# Patient Record
Sex: Male | Born: 1960 | Race: White | Hispanic: No | Marital: Married | Smoking: Never smoker
Health system: Southern US, Community
[De-identification: ages and names within clinical notes are randomized; demographics above are authoritative.]

## PROBLEM LIST (undated history)

## (undated) DIAGNOSIS — F329 Major depressive disorder, single episode, unspecified: Secondary | ICD-10-CM

## (undated) DIAGNOSIS — Z9889 Other specified postprocedural states: Secondary | ICD-10-CM

## (undated) DIAGNOSIS — F32A Depression, unspecified: Secondary | ICD-10-CM

## (undated) DIAGNOSIS — R112 Nausea with vomiting, unspecified: Secondary | ICD-10-CM

## (undated) DIAGNOSIS — T8859XA Other complications of anesthesia, initial encounter: Secondary | ICD-10-CM

## (undated) DIAGNOSIS — E119 Type 2 diabetes mellitus without complications: Secondary | ICD-10-CM

## (undated) DIAGNOSIS — T4145XA Adverse effect of unspecified anesthetic, initial encounter: Secondary | ICD-10-CM

## (undated) DIAGNOSIS — I10 Essential (primary) hypertension: Secondary | ICD-10-CM

## (undated) DIAGNOSIS — R51 Headache: Secondary | ICD-10-CM

## (undated) DIAGNOSIS — M1990 Unspecified osteoarthritis, unspecified site: Secondary | ICD-10-CM

## (undated) DIAGNOSIS — F419 Anxiety disorder, unspecified: Secondary | ICD-10-CM

## (undated) DIAGNOSIS — J40 Bronchitis, not specified as acute or chronic: Secondary | ICD-10-CM

## (undated) HISTORY — PX: TONSILLECTOMY: SUR1361

## (undated) HISTORY — PX: KNEE SURGERY: SHX244

## (undated) HISTORY — PX: HERNIA REPAIR: SHX51

---

## 2012-05-30 ENCOUNTER — Other Ambulatory Visit: Payer: Self-pay | Admitting: Neurosurgery

## 2012-06-04 ENCOUNTER — Encounter (HOSPITAL_COMMUNITY): Payer: Self-pay | Admitting: Pharmacy Technician

## 2012-06-06 ENCOUNTER — Encounter (HOSPITAL_COMMUNITY)
Admission: RE | Admit: 2012-06-06 | Discharge: 2012-06-06 | Disposition: A | Discharge status: Home / Self Care | Payer: BC Managed Care – PPO | Source: Ambulatory Visit | Attending: Neurosurgery | Admitting: Neurosurgery

## 2012-06-06 ENCOUNTER — Encounter (HOSPITAL_COMMUNITY): Payer: Self-pay

## 2012-06-06 ENCOUNTER — Encounter (HOSPITAL_COMMUNITY)
Admission: RE | Admit: 2012-06-06 | Discharge: 2012-06-06 | Disposition: A | Discharge status: Home / Self Care | Payer: BC Managed Care – PPO | Source: Ambulatory Visit | Attending: Anesthesiology | Admitting: Anesthesiology

## 2012-06-06 HISTORY — DX: Other complications of anesthesia, initial encounter: T88.59XA

## 2012-06-06 HISTORY — DX: Anxiety disorder, unspecified: F41.9

## 2012-06-06 HISTORY — DX: Other specified postprocedural states: Z98.890

## 2012-06-06 HISTORY — DX: Major depressive disorder, single episode, unspecified: F32.9

## 2012-06-06 HISTORY — DX: Adverse effect of unspecified anesthetic, initial encounter: T41.45XA

## 2012-06-06 HISTORY — DX: Unspecified osteoarthritis, unspecified site: M19.90

## 2012-06-06 HISTORY — DX: Depression, unspecified: F32.A

## 2012-06-06 HISTORY — DX: Bronchitis, not specified as acute or chronic: J40

## 2012-06-06 HISTORY — DX: Essential (primary) hypertension: I10

## 2012-06-06 HISTORY — DX: Type 2 diabetes mellitus without complications: E11.9

## 2012-06-06 HISTORY — DX: Nausea with vomiting, unspecified: R11.2

## 2012-06-06 HISTORY — DX: Headache: R51

## 2012-06-06 LAB — SURGICAL PCR SCREEN: MRSA, PCR: NEGATIVE

## 2012-06-06 LAB — CBC
Hemoglobin: 13.1 g/dL (ref 13.0–17.0)
MCH: 29.6 pg (ref 26.0–34.0)
MCV: 88.5 fL (ref 78.0–100.0)
Platelets: 195 10*3/uL (ref 150–400)
RBC: 4.43 MIL/uL (ref 4.22–5.81)
WBC: 7.5 10*3/uL (ref 4.0–10.5)

## 2012-06-06 LAB — BASIC METABOLIC PANEL
CO2: 30 mEq/L (ref 19–32)
Calcium: 9.1 mg/dL (ref 8.4–10.5)
Chloride: 99 mEq/L (ref 96–112)
Glucose, Bld: 180 mg/dL — ABNORMAL HIGH (ref 70–99)
Sodium: 138 mEq/L (ref 135–145)

## 2012-06-06 MED ORDER — CEFAZOLIN SODIUM-DEXTROSE 2-3 GM-% IV SOLR
2.0000 g | INTRAVENOUS | Status: AC
  Administered 2012-06-07: 2 g via INTRAVENOUS
  Filled 2012-06-06: qty 50

## 2012-06-06 NOTE — Progress Notes (Signed)
06/06/12 0915  OBSTRUCTIVE SLEEP APNEA  Have you ever been diagnosed with sleep apnea through a sleep study? No  Do you snore loudly (loud enough to be heard through closed doors)?  1  Do you often feel tired, fatigued, or sleepy during the daytime? 0  Has anyone observed you stop breathing during your sleep? 0  Do you have, or are you being treated for high blood pressure? 1  BMI more than 35 kg/m2? 1  Age over 53 years old? 1  Neck circumference greater than 40 cm/18 inches? 1 (18 1/2 inches)  Gender: 1  Obstructive Sleep Apnea Score 6   Score 4 or greater  Results sent to PCP

## 2012-06-06 NOTE — H&P (Signed)
NEUROSURGICAL CONSULTATION  Trapper A. 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  He complains of back and left leg pain.  He complains of left leg pain for the last three months, right leg only recently.  He says his low back pain has been worse than his leg now since he has been started on Gabapentin but prior to Gabapentin his leg pain was much worse than his back pain.  He notes numbness into both feet, weakness in his left leg and says this has been going on for the last three months.  He has been taking over-the-counter medications for years for his low back but says that the left leg pain is quite severe.  He went to physical therapy for chiropractic treatment.  He said traction helped some but he has not had a great deal of relief.  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  He has had previous knee arthroscopic surgery in 11/2001.  He says it is very hard for him to stand.  He has to lean forward in order to try to do so.    REVIEW OF SYSTEMS:   A detailed Review of Systems sheet was reviewed with the patient.  Pertinent positives include:  Eyes-glasses; Cardiovascular-high blood pressure, leg pain while walking; Musculoskeletal-back pain, leg pain; Psychiatric-anxiety; Endocrine-diabetes.  All other systems are negative; this includes Constitutional symptoms, Ears, nose, mouth, throat, Respiratory, Gastrointestinal, Genitourinary, Integumentary & Breast, Neurologic, Hematologic/Lymphatic, Allergic/Immunologic.    PAST MEDICAL HISTORY:      Current Medical Conditions:    As previously described.      Prior Operations and Hospitalizations:   As previously described.    Medications and Allergies:   He is currently  taking Gabapentin 300 mg. b.i.d., Hydrocodone q.i.d., but nothing lasts or helps for greater than an hour.  Mobic 15 mg. q.a.m., Carvedilol 25 mg., Alprazolam 1 mg., Januvia 100 mg., Sertraline 100 mg., Lantus 40 units, Humalog sliding scale.   No known drug allergies.      Height and Weight:     5'11" tall, 250 pounds.       FAMILY HISTORY:    Mother 81 in good health with diabetes.  Father is 61 in poor health with Shy-Drager syndrome.   SOCIAL HISTORY:    He is a nonsmoker and nondrinker with no history of substance abuse.      DIAGNOSTIC STUDIES:   Mr. Sia had an MRI of his lumbar spine which shows that at L4-5 there is left sided annular protrusion and compression of the left L5 nerve root with soft tissue in the epidural space further compressing the left L5 nerve root and extending into the neural foramen which appears to be consistent with herniated disc material.      PHYSICAL EXAMINATION:      General Appearance:   On examination today, Cory Kidd is a pleasant and cooperative man in no acute distress.        Blood Pressure, Pulse:     152/100, heart rate 74 and regular, respirations 16.      HEENT - normocephalic, atraumatic.  The pupils are equal, round and reactive to light.  The extraocular muscles are intact.  Sclerae - white.  Conjunctiva - pink.  Oropharynx benign.  Uvula midline.     Neck - there are no masses, meningismus, deformities, tracheal deviation, jugular vein distention or carotid bruits.  There is normal cervical range of motion.  Spurlings' test is negative without reproducible radicular pain turning the patient's head to either side.  Lhermitte's sign is not present with axial compression.      Respiratory - there is normal respiratory effort with good intercostal function.  Lungs are clear to auscultation.  There are no rales, rhonchi or wheezes.      Cardiovascular - the heart has regular rate and rhythm to auscultation.  No murmurs are appreciated.   There is no extremity edema, cyanosis or clubbing.  There are palpable pedal pulses.      Abdomen - soft, nontender, no hepatosplenomegaly appreciated or masses.  There are active bowel sounds.  No guarding or rebound.      Musculoskeletal Examination - he has midline low back pain to palpation.  He has left sciatic notch discomfort.  He is able to bend to the level of his knees with his upper extremities outstretched.  He is able to stand on his heels and toes and walks with an antalgic gait favoring his left lower extremity.  He has a decreased ability to swat on the left leg.  He has positive straight leg raise on the left at 20 degrees and on the right at 50 degrees.    NEUROLOGICAL EXAMINATION: The patient is oriented to time, person and place and has good recall of both recent and remote memory with normal attention span and concentration.  The patient speaks with clear and fluent speech and exhibits normal language function and appropriate fund of knowledge.        Cranial Nerve Examination - pupils are equal, round and reactive to light.  Extraocular movements are full.  Visual fields are full to confrontational testing.  Facial sensation and facial movement are symmetric and intact.  Hearing is intact to finger rub.  Palate is upgoing.  Shoulder shrug is symmetric.  Tongue protrudes in the midline.      Motor Examination - motor strength is 5/5 in the bilateral deltoids, biceps, triceps, handgrips, wrist extensors, interosseous.  In the lower extremities motor strength is 5/5 in hip flexion, extension, quadriceps, hamstrings, plantar flexion, dorsiflexion and extensor hallucis longus.  He has the decreased ability to squat on the left leg.      Sensory Examination - he notes decreased pin sensation in the left L4 distribution.      Deep Tendon Reflexes - 2 in the biceps, triceps, and brachioradialis, 2 in the knees, 2 in the ankles.  The great toes are downgoing to plantar stimulation.  No  pathologic reflexes.       Cerebellar Examination - normal coordination in upper and lower extremities and normal rapid alternating movements.  Romberg test is negative.    IMPRESSION AND RECOMMENDATIONS: Ayman Brull is a 51 year old man from [place] with low back and left leg pain with a large disc herniation at L4-5 on the left which appears to be causing compression of both the L5 and L4 nerve root.    I have recommended that he undergo surgery for this given the severity of his pain and weakness and physical findings with the large free fragment of herniated disc material.  He wishes to proceed and we will set this up on 06/07/2012.  Risks and benefits are discussed.  We went over the surgical models.  I reviewed the studies with the patient and went over his physical examination.  I reviewed surgical models and discussed the typical hospital course and operative and postoperative course and the potential risks and benefits of surgery.  The risks of surgery were discussed in detail and include, but are not limited to, the risks of anesthesia, blood loss and the possibility of hemorrhage, infection, damage to nerves, damage to blood vessels, injury to the lumbar nerve root causing either temporary or permanent leg pain, numbness, weakness.  There is potential for spinal fluid leak from dural tear.  There is the potential for post-laminectomy spondylolisthesis, recurrent disc ruptured quoted at approximately 10%, failure to relieve pain, worsening of pain, need for further surgery.      [facility]    Danae Orleans. Venetia Maxon, M.D.  JDS:gde

## 2012-06-06 NOTE — Pre-Procedure Instructions (Signed)
20 Cory Kidd  06/06/2012   Your procedure is scheduled on:  Friday June 07, 2012  Report to [facility] at 6:00 AM.  Call this number if you have problems the morning of surgery: [phone]   Remember:   Do not eat food or drink :After Midnight.    Take these medicines the morning of surgery with A SIP OF WATER: xanax, carvedilol, gabapentin, percocet, zoloft    Do not wear jewelry, make-up or nail polish.  Do not wear lotions, powders, or perfumes.  Do not shave 48 hours prior to surgery.  Do not bring valuables to the hospital.  Contacts, dentures or bridgework may not be worn into surgery.  Leave suitcase in the car. After surgery it may be brought to your room.  For patients admitted to the hospital, checkout time is 11:00 AM the day of discharge.   Patients discharged the day of surgery will not be allowed to drive home.  Name and phone number of your driver: family / friend  Special Instructions: Shower using CHG 2 nights before surgery and the night before surgery.  If you shower the day of surgery use CHG.  Use special wash - you have one bottle of CHG for all showers.  You should use approximately 1/3 of the bottle for each shower.   Please read over the following fact sheets that you were given: Pain Booklet, Coughing and Deep Breathing, MRSA Information and Surgical Site Infection Prevention

## 2012-06-06 NOTE — Progress Notes (Signed)
06/06/12 1012  OBSTRUCTIVE SLEEP APNEA  Have you ever been diagnosed with sleep apnea through a sleep study? No  Do you snore loudly (loud enough to be heard through closed doors)?  1  Do you often feel tired, fatigued, or sleepy during the daytime? 0  Has anyone observed you stop breathing during your sleep? 0  Do you have, or are you being treated for high blood pressure? 1  BMI more than 35 kg/m2? 1  Age over 53 years old? 1  Neck circumference greater than 40 cm/18 inches? 1  Gender: 1  Obstructive Sleep Apnea Score 6   Score 4 or greater  Results sent to PCP

## 2012-06-07 ENCOUNTER — Encounter (HOSPITAL_COMMUNITY): Payer: Self-pay | Admitting: Surgery

## 2012-06-07 ENCOUNTER — Encounter (HOSPITAL_COMMUNITY): Payer: Self-pay | Admitting: Anesthesiology

## 2012-06-07 ENCOUNTER — Ambulatory Visit (HOSPITAL_COMMUNITY): Payer: BC Managed Care – PPO | Admitting: Anesthesiology

## 2012-06-07 ENCOUNTER — Ambulatory Visit (HOSPITAL_COMMUNITY): Payer: BC Managed Care – PPO

## 2012-06-07 ENCOUNTER — Ambulatory Visit (HOSPITAL_COMMUNITY)
Admission: RE | Admit: 2012-06-07 | Discharge: 2012-06-07 | Disposition: A | Discharge status: Home / Self Care | Payer: BC Managed Care – PPO | Source: Ambulatory Visit | Attending: Neurosurgery | Admitting: Neurosurgery

## 2012-06-07 ENCOUNTER — Encounter (HOSPITAL_COMMUNITY)
Admission: RE | Disposition: A | Discharge status: Home / Self Care | Payer: Self-pay | Source: Ambulatory Visit | Attending: Neurosurgery

## 2012-06-07 DIAGNOSIS — I10 Essential (primary) hypertension: Secondary | ICD-10-CM | POA: Insufficient documentation

## 2012-06-07 DIAGNOSIS — M5137 Other intervertebral disc degeneration, lumbosacral region: Secondary | ICD-10-CM | POA: Insufficient documentation

## 2012-06-07 DIAGNOSIS — Z01810 Encounter for preprocedural cardiovascular examination: Secondary | ICD-10-CM | POA: Insufficient documentation

## 2012-06-07 DIAGNOSIS — Z01818 Encounter for other preprocedural examination: Secondary | ICD-10-CM | POA: Insufficient documentation

## 2012-06-07 DIAGNOSIS — M47817 Spondylosis without myelopathy or radiculopathy, lumbosacral region: Secondary | ICD-10-CM | POA: Insufficient documentation

## 2012-06-07 DIAGNOSIS — E119 Type 2 diabetes mellitus without complications: Secondary | ICD-10-CM | POA: Insufficient documentation

## 2012-06-07 DIAGNOSIS — Z794 Long term (current) use of insulin: Secondary | ICD-10-CM | POA: Insufficient documentation

## 2012-06-07 DIAGNOSIS — M5126 Other intervertebral disc displacement, lumbar region: Secondary | ICD-10-CM | POA: Insufficient documentation

## 2012-06-07 DIAGNOSIS — Z01812 Encounter for preprocedural laboratory examination: Secondary | ICD-10-CM | POA: Insufficient documentation

## 2012-06-07 DIAGNOSIS — M51379 Other intervertebral disc degeneration, lumbosacral region without mention of lumbar back pain or lower extremity pain: Secondary | ICD-10-CM | POA: Insufficient documentation

## 2012-06-07 HISTORY — PX: LUMBAR LAMINECTOMY/DECOMPRESSION MICRODISCECTOMY: SHX5026

## 2012-06-07 LAB — GLUCOSE, CAPILLARY
Glucose-Capillary: 177 mg/dL — ABNORMAL HIGH (ref 70–99)
Glucose-Capillary: 141 mg/dL — ABNORMAL HIGH (ref 70–99)
Glucose-Capillary: 160 mg/dL — ABNORMAL HIGH (ref 70–99)

## 2012-06-07 SURGERY — LUMBAR LAMINECTOMY/DECOMPRESSION MICRODISCECTOMY 1 LEVEL
Anesthesia: General | Site: Back | Laterality: Left | Wound class: Clean

## 2012-06-07 MED ORDER — ALPRAZOLAM 0.5 MG PO TABS: 1.0000 mg | ORAL_TABLET | Freq: Two times a day (BID) | ORAL | Status: DC | PRN

## 2012-06-07 MED ORDER — MELOXICAM 15 MG PO TABS: 15.0000 mg | ORAL_TABLET | Freq: Every day | ORAL | Status: DC

## 2012-06-07 MED ORDER — FLEET ENEMA 7-19 GM/118ML RE ENEM
1.0000 | ENEMA | Freq: Once | RECTAL | Status: DC | PRN
  Filled 2012-06-07: qty 1

## 2012-06-07 MED ORDER — PHENYLEPHRINE HCL 10 MG/ML IJ SOLN
INTRAMUSCULAR | Status: DC | PRN
  Administered 2012-06-07 (×2): 40 ug via INTRAVENOUS
  Administered 2012-06-07: 80 ug via INTRAVENOUS
  Administered 2012-06-07 (×2): 40 ug via INTRAVENOUS
  Administered 2012-06-07: 80 ug via INTRAVENOUS
  Administered 2012-06-07 (×2): 40 ug via INTRAVENOUS

## 2012-06-07 MED ORDER — INFLUENZA VIRUS VACC SPLIT PF IM SUSP
0.5000 mL | Freq: Once | INTRAMUSCULAR | Status: AC
  Administered 2012-06-07: 0.5 mL via INTRAMUSCULAR
  Filled 2012-06-07: qty 0.5

## 2012-06-07 MED ORDER — MUPIROCIN 2 % EX OINT: 1.0000 "application " | TOPICAL_OINTMENT | Freq: Two times a day (BID) | CUTANEOUS | Status: DC

## 2012-06-07 MED ORDER — DIAZEPAM 5 MG PO TABS: 5.0000 mg | ORAL_TABLET | Freq: Four times a day (QID) | ORAL | Status: DC | PRN

## 2012-06-07 MED ORDER — ALUM & MAG HYDROXIDE-SIMETH 200-200-20 MG/5ML PO SUSP: 30.0000 mL | Freq: Four times a day (QID) | ORAL | Status: DC | PRN

## 2012-06-07 MED ORDER — BISACODYL 10 MG RE SUPP: 10.0000 mg | Freq: Every day | RECTAL | Status: DC | PRN

## 2012-06-07 MED ORDER — SERTRALINE HCL 100 MG PO TABS
100.0000 mg | ORAL_TABLET | Freq: Every day | ORAL | Status: DC
  Filled 2012-06-07: qty 1

## 2012-06-07 MED ORDER — HYDROMORPHONE HCL PF 1 MG/ML IJ SOLN
INTRAMUSCULAR | Status: AC
  Filled 2012-06-07: qty 2

## 2012-06-07 MED ORDER — BACITRACIN-NEOMYCIN-POLYMYXIN 400-5-5000 EX OINT: 1.0000 "application " | TOPICAL_OINTMENT | CUTANEOUS | Status: DC | PRN

## 2012-06-07 MED ORDER — MORPHINE SULFATE 2 MG/ML IJ SOLN: 1.0000 mg | INTRAMUSCULAR | Status: DC | PRN

## 2012-06-07 MED ORDER — PANTOPRAZOLE SODIUM 40 MG IV SOLR
40.0000 mg | Freq: Every day | INTRAVENOUS | Status: DC
  Filled 2012-06-07: qty 40

## 2012-06-07 MED ORDER — INSULIN GLARGINE 100 UNIT/ML ~~LOC~~ SOLN: 20.0000 [IU] | Freq: Two times a day (BID) | SUBCUTANEOUS | Status: DC

## 2012-06-07 MED ORDER — CEFAZOLIN SODIUM 1-5 GM-% IV SOLN
1.0000 g | Freq: Three times a day (TID) | INTRAVENOUS | Status: DC
  Filled 2012-06-07 (×2): qty 50

## 2012-06-07 MED ORDER — SODIUM CHLORIDE 0.9 % IJ SOLN: 3.0000 mL | INTRAMUSCULAR | Status: DC | PRN

## 2012-06-07 MED ORDER — INSULIN ASPART 100 UNIT/ML ~~LOC~~ SOLN: 0.0000 [IU] | Freq: Three times a day (TID) | SUBCUTANEOUS | Status: DC

## 2012-06-07 MED ORDER — DOCUSATE SODIUM 100 MG PO CAPS: 100.0000 mg | ORAL_CAPSULE | Freq: Two times a day (BID) | ORAL | Status: DC

## 2012-06-07 MED ORDER — PROPOFOL 10 MG/ML IV BOLUS
INTRAVENOUS | Status: DC | PRN
  Administered 2012-06-07: 180 mg via INTRAVENOUS

## 2012-06-07 MED ORDER — ONDANSETRON HCL 4 MG/2ML IJ SOLN: 4.0000 mg | Freq: Once | INTRAMUSCULAR | Status: DC | PRN

## 2012-06-07 MED ORDER — SCOPOLAMINE 1 MG/3DAYS TD PT72
MEDICATED_PATCH | TRANSDERMAL | Status: AC
  Administered 2012-06-07: 1.5 mg via TRANSDERMAL
  Filled 2012-06-07: qty 1

## 2012-06-07 MED ORDER — LIDOCAINE HCL (CARDIAC) 20 MG/ML IV SOLN
INTRAVENOUS | Status: DC | PRN
  Administered 2012-06-07: 30 mg via INTRAVENOUS

## 2012-06-07 MED ORDER — ZOLPIDEM TARTRATE 5 MG PO TABS: 5.0000 mg | ORAL_TABLET | Freq: Every evening | ORAL | Status: DC | PRN

## 2012-06-07 MED ORDER — OXYCODONE-ACETAMINOPHEN 10-325 MG PO TABS
1.0000 | ORAL_TABLET | Freq: Four times a day (QID) | ORAL | Status: DC | PRN
  Filled 2012-06-07: qty 1

## 2012-06-07 MED ORDER — FENTANYL CITRATE 0.05 MG/ML IJ SOLN
INTRAMUSCULAR | Status: DC | PRN
  Administered 2012-06-07: 100 ug via INTRAVENOUS

## 2012-06-07 MED ORDER — PHENOL 1.4 % MT LIQD: 1.0000 | OROMUCOSAL | Status: DC | PRN

## 2012-06-07 MED ORDER — BACITRACIN 50000 UNITS IM SOLR
INTRAMUSCULAR | Status: AC
  Filled 2012-06-07: qty 1

## 2012-06-07 MED ORDER — THROMBIN 5000 UNITS EX SOLR
CUTANEOUS | Status: DC | PRN
  Administered 2012-06-07 (×2): 5000 [IU] via TOPICAL

## 2012-06-07 MED ORDER — MUPIROCIN 2 % EX OINT
TOPICAL_OINTMENT | Freq: Two times a day (BID) | CUTANEOUS | Status: DC
  Administered 2012-06-07: 1 via NASAL

## 2012-06-07 MED ORDER — VECURONIUM BROMIDE 10 MG IV SOLR
INTRAVENOUS | Status: DC | PRN
  Administered 2012-06-07 (×2): 1 mg via INTRAVENOUS

## 2012-06-07 MED ORDER — ONDANSETRON HCL 4 MG/2ML IJ SOLN
INTRAMUSCULAR | Status: DC | PRN
  Administered 2012-06-07 (×2): 4 mg via INTRAVENOUS

## 2012-06-07 MED ORDER — HEMOSTATIC AGENTS (NO CHARGE) OPTIME
TOPICAL | Status: DC | PRN
  Administered 2012-06-07: 1 via TOPICAL

## 2012-06-07 MED ORDER — ACETAMINOPHEN 325 MG PO TABS: 650.0000 mg | ORAL_TABLET | ORAL | Status: DC | PRN

## 2012-06-07 MED ORDER — CARVEDILOL 25 MG PO TABS
25.0000 mg | ORAL_TABLET | Freq: Two times a day (BID) | ORAL | Status: DC
  Filled 2012-06-07 (×2): qty 1

## 2012-06-07 MED ORDER — GABAPENTIN 300 MG PO CAPS
300.0000 mg | ORAL_CAPSULE | Freq: Two times a day (BID) | ORAL | Status: DC
  Filled 2012-06-07 (×2): qty 1

## 2012-06-07 MED ORDER — MUPIROCIN 2 % EX OINT
TOPICAL_OINTMENT | CUTANEOUS | Status: AC
  Administered 2012-06-07: 1 via NASAL
  Filled 2012-06-07: qty 22

## 2012-06-07 MED ORDER — ACETAMINOPHEN 10 MG/ML IV SOLN: 1000.0000 mg | Freq: Once | INTRAVENOUS | Status: DC | PRN

## 2012-06-07 MED ORDER — LACTATED RINGERS IV SOLN
INTRAVENOUS | Status: DC | PRN
  Administered 2012-06-07 (×2): via INTRAVENOUS

## 2012-06-07 MED ORDER — EPHEDRINE SULFATE 50 MG/ML IJ SOLN
INTRAMUSCULAR | Status: DC | PRN
  Administered 2012-06-07: 10 mg via INTRAVENOUS

## 2012-06-07 MED ORDER — ACETAMINOPHEN 650 MG RE SUPP: 650.0000 mg | RECTAL | Status: DC | PRN

## 2012-06-07 MED ORDER — INSULIN ASPART 100 UNIT/ML ~~LOC~~ SOLN: 0.0000 [IU] | Freq: Every day | SUBCUTANEOUS | Status: DC

## 2012-06-07 MED ORDER — DEXTROSE 5 % IV SOLN
10.0000 mg | INTRAVENOUS | Status: DC | PRN
  Administered 2012-06-07: 20 ug/min via INTRAVENOUS

## 2012-06-07 MED ORDER — HYDROCODONE-ACETAMINOPHEN 5-325 MG PO TABS: 1.0000 | ORAL_TABLET | ORAL | Status: DC | PRN

## 2012-06-07 MED ORDER — MENTHOL 3 MG MT LOZG: 1.0000 | LOZENGE | OROMUCOSAL | Status: DC | PRN

## 2012-06-07 MED ORDER — POLYETHYLENE GLYCOL 3350 17 G PO PACK
17.0000 g | PACK | Freq: Every day | ORAL | Status: DC | PRN
  Filled 2012-06-07: qty 1

## 2012-06-07 MED ORDER — LINAGLIPTIN 5 MG PO TABS
5.0000 mg | ORAL_TABLET | Freq: Every day | ORAL | Status: DC
  Filled 2012-06-07: qty 1

## 2012-06-07 MED ORDER — SODIUM CHLORIDE 0.9 % IV SOLN: 250.0000 mL | INTRAVENOUS | Status: DC

## 2012-06-07 MED ORDER — ONDANSETRON HCL 4 MG/2ML IJ SOLN: 4.0000 mg | INTRAMUSCULAR | Status: DC | PRN

## 2012-06-07 MED ORDER — SODIUM CHLORIDE 0.9 % IJ SOLN: 3.0000 mL | Freq: Two times a day (BID) | INTRAMUSCULAR | Status: DC

## 2012-06-07 MED ORDER — HYDROMORPHONE HCL PF 1 MG/ML IJ SOLN
0.2500 mg | INTRAMUSCULAR | Status: DC | PRN
  Administered 2012-06-07 (×3): 0.5 mg via INTRAVENOUS

## 2012-06-07 MED ORDER — FENTANYL CITRATE 0.05 MG/ML IJ SOLN
INTRAMUSCULAR | Status: DC | PRN
  Administered 2012-06-07: 150 ug via INTRAVENOUS

## 2012-06-07 MED ORDER — ADULT MULTIVITAMIN W/MINERALS CH
1.0000 | ORAL_TABLET | Freq: Every day | ORAL | Status: DC
  Filled 2012-06-07: qty 1

## 2012-06-07 MED ORDER — NEOSTIGMINE METHYLSULFATE 1 MG/ML IJ SOLN
INTRAMUSCULAR | Status: DC | PRN
  Administered 2012-06-07: 5 mg via INTRAVENOUS

## 2012-06-07 MED ORDER — INSULIN ASPART 100 UNIT/ML ~~LOC~~ SOLN: 4.0000 [IU] | Freq: Three times a day (TID) | SUBCUTANEOUS | Status: DC

## 2012-06-07 MED ORDER — SODIUM CHLORIDE 0.9 % IV SOLN
INTRAVENOUS | Status: AC
  Filled 2012-06-07: qty 500

## 2012-06-07 MED ORDER — LIDOCAINE-EPINEPHRINE 1 %-1:100000 IJ SOLN
INTRAMUSCULAR | Status: DC | PRN
  Administered 2012-06-07: 10 mL

## 2012-06-07 MED ORDER — GLYCOPYRROLATE 0.2 MG/ML IJ SOLN
INTRAMUSCULAR | Status: DC | PRN
  Administered 2012-06-07: .8 mg via INTRAVENOUS

## 2012-06-07 MED ORDER — ACETAMINOPHEN 10 MG/ML IV SOLN
INTRAVENOUS | Status: AC
  Administered 2012-06-07: 1000 mg via INTRAVENOUS
  Filled 2012-06-07: qty 100

## 2012-06-07 MED ORDER — MIDAZOLAM HCL 5 MG/5ML IJ SOLN
INTRAMUSCULAR | Status: DC | PRN
  Administered 2012-06-07: 2 mg via INTRAVENOUS

## 2012-06-07 MED ORDER — SODIUM CHLORIDE 0.9 % IR SOLN
Status: DC | PRN
  Administered 2012-06-07: 10:00:00

## 2012-06-07 MED ORDER — FENTANYL CITRATE 0.05 MG/ML IJ SOLN
INTRAMUSCULAR | Status: AC
  Filled 2012-06-07: qty 2

## 2012-06-07 MED ORDER — KCL IN DEXTROSE-NACL 20-5-0.45 MEQ/L-%-% IV SOLN
INTRAVENOUS | Status: DC
  Filled 2012-06-07 (×2): qty 1000

## 2012-06-07 MED ORDER — METHYLPREDNISOLONE ACETATE 80 MG/ML IJ SUSP
INTRAMUSCULAR | Status: DC | PRN
  Administered 2012-06-07: 80 mg via INTRA_ARTICULAR

## 2012-06-07 MED ORDER — BUPIVACAINE HCL (PF) 0.5 % IJ SOLN
INTRAMUSCULAR | Status: DC | PRN
  Administered 2012-06-07: 10 mL

## 2012-06-07 MED ORDER — SENNA 8.6 MG PO TABS: 1.0000 | ORAL_TABLET | Freq: Two times a day (BID) | ORAL | Status: DC

## 2012-06-07 MED ORDER — OXYCODONE-ACETAMINOPHEN 5-325 MG PO TABS
1.0000 | ORAL_TABLET | ORAL | Status: DC | PRN
  Administered 2012-06-07: 2 via ORAL
  Filled 2012-06-07: qty 2

## 2012-06-07 MED ORDER — ROCURONIUM BROMIDE 100 MG/10ML IV SOLN
INTRAVENOUS | Status: DC | PRN
Start: 2012-06-07 — End: 2012-06-07
  Administered 2012-06-07: 50 mg via INTRAVENOUS

## 2012-06-07 SURGICAL SUPPLY — 60 items
BAG DECANTER FOR FLEXI CONT (MISCELLANEOUS) ×2 IMPLANT
BENZOIN TINCTURE PRP APPL 2/3 (GAUZE/BANDAGES/DRESSINGS) ×2 IMPLANT
BIT DRILL NEURO 2X3.1 SFT TUCH (MISCELLANEOUS) ×1 IMPLANT
BLADE SURG ROTATE 9660 (MISCELLANEOUS) ×2 IMPLANT
BUR ROUND FLUTED 5 RND (BURR) ×2 IMPLANT
CANISTER SUCTION 2500CC (MISCELLANEOUS) ×2 IMPLANT
CLOTH BEACON ORANGE TIMEOUT ST (SAFETY) ×2 IMPLANT
CONT SPEC 4OZ CLIKSEAL STRL BL (MISCELLANEOUS) ×2 IMPLANT
DERMABOND ADVANCED (GAUZE/BANDAGES/DRESSINGS) ×1
DERMABOND ADVANCED .7 DNX12 (GAUZE/BANDAGES/DRESSINGS) ×1 IMPLANT
DRAPE LAPAROTOMY 100X72X124 (DRAPES) ×2 IMPLANT
DRAPE MICROSCOPE LEICA (MISCELLANEOUS) IMPLANT
DRAPE MICROSCOPE ZEISS OPMI (DRAPES) ×2 IMPLANT
DRAPE POUCH INSTRU U-SHP 10X18 (DRAPES) ×2 IMPLANT
DRAPE SURG 17X23 STRL (DRAPES) ×2 IMPLANT
DRESSING TELFA 8X3 (GAUZE/BANDAGES/DRESSINGS) IMPLANT
DRILL NEURO 2X3.1 SOFT TOUCH (MISCELLANEOUS) ×2
DURAPREP 26ML APPLICATOR (WOUND CARE) ×2 IMPLANT
ELECT BLADE 4.0 EZ CLEAN MEGAD (MISCELLANEOUS) ×2
ELECT REM PT RETURN 9FT ADLT (ELECTROSURGICAL) ×2
ELECTRODE BLDE 4.0 EZ CLN MEGD (MISCELLANEOUS) ×1 IMPLANT
ELECTRODE REM PT RTRN 9FT ADLT (ELECTROSURGICAL) ×1 IMPLANT
GAUZE SPONGE 4X4 16PLY XRAY LF (GAUZE/BANDAGES/DRESSINGS) IMPLANT
GLOVE BIO SURGEON STRL SZ8 (GLOVE) ×2 IMPLANT
GLOVE BIOGEL PI IND STRL 8 (GLOVE) ×2 IMPLANT
GLOVE BIOGEL PI IND STRL 8.5 (GLOVE) ×1 IMPLANT
GLOVE BIOGEL PI INDICATOR 8 (GLOVE) ×2
GLOVE BIOGEL PI INDICATOR 8.5 (GLOVE) ×1
GLOVE ECLIPSE 7.5 STRL STRAW (GLOVE) ×4 IMPLANT
GLOVE ECLIPSE 8.0 STRL XLNG CF (GLOVE) ×2 IMPLANT
GLOVE EXAM NITRILE LRG STRL (GLOVE) IMPLANT
GLOVE EXAM NITRILE MD LF STRL (GLOVE) ×2 IMPLANT
GLOVE EXAM NITRILE XL STR (GLOVE) IMPLANT
GLOVE EXAM NITRILE XS STR PU (GLOVE) IMPLANT
GLOVE INDICATOR 7.0 STRL GRN (GLOVE) ×2 IMPLANT
GLOVE SURG SS PI 7.0 STRL IVOR (GLOVE) ×4 IMPLANT
GOWN BRE IMP SLV AUR LG STRL (GOWN DISPOSABLE) IMPLANT
GOWN BRE IMP SLV AUR XL STRL (GOWN DISPOSABLE) ×2 IMPLANT
GOWN STRL REIN 2XL LVL4 (GOWN DISPOSABLE) ×6 IMPLANT
KIT BASIN OR (CUSTOM PROCEDURE TRAY) ×2 IMPLANT
KIT ROOM TURNOVER OR (KITS) ×2 IMPLANT
NEEDLE HYPO 18GX1.5 BLUNT FILL (NEEDLE) ×2 IMPLANT
NEEDLE HYPO 25X1 1.5 SAFETY (NEEDLE) ×2 IMPLANT
NS IRRIG 1000ML POUR BTL (IV SOLUTION) ×2 IMPLANT
PACK LAMINECTOMY NEURO (CUSTOM PROCEDURE TRAY) ×2 IMPLANT
PAD ARMBOARD 7.5X6 YLW CONV (MISCELLANEOUS) ×6 IMPLANT
RUBBERBAND STERILE (MISCELLANEOUS) ×4 IMPLANT
SPONGE GAUZE 4X4 12PLY (GAUZE/BANDAGES/DRESSINGS) ×2 IMPLANT
SPONGE SURGIFOAM ABS GEL SZ50 (HEMOSTASIS) ×2 IMPLANT
STRIP CLOSURE SKIN 1/2X4 (GAUZE/BANDAGES/DRESSINGS) ×2 IMPLANT
SUT VIC AB 0 CT1 18XCR BRD8 (SUTURE) ×1 IMPLANT
SUT VIC AB 0 CT1 8-18 (SUTURE) ×1
SUT VIC AB 2-0 CT1 18 (SUTURE) ×2 IMPLANT
SUT VIC AB 3-0 SH 8-18 (SUTURE) ×2 IMPLANT
SYR 20ML ECCENTRIC (SYRINGE) ×2 IMPLANT
SYR 5ML LL (SYRINGE) ×2 IMPLANT
TAPE CLOTH SURG 4X10 WHT LF (GAUZE/BANDAGES/DRESSINGS) ×2 IMPLANT
TOWEL OR 17X24 6PK STRL BLUE (TOWEL DISPOSABLE) ×2 IMPLANT
TOWEL OR 17X26 10 PK STRL BLUE (TOWEL DISPOSABLE) ×2 IMPLANT
WATER STERILE IRR 1000ML POUR (IV SOLUTION) ×2 IMPLANT

## 2012-06-07 NOTE — Brief Op Note (Signed)
06/07/2012  12:12 PM  PATIENT:  Cory Kidd  51 y.o. male  PRE-OPERATIVE DIAGNOSIS:  Lumbar hnp without myelopathy, Lumbar spondylosis, Lumbar degenerative disc disease, Lumbar radiculopathy L4/5 left  POST-OPERATIVE DIAGNOSIS:  Lumbar hnp without myelopathy, Lumbar spondylosis, Lumbar degenerative disc disease, Lumbar radiculopathy L4/5 left  PROCEDURE:  Procedure(s) (LRB) with comments: LUMBAR LAMINECTOMY/DECOMPRESSION MICRODISCECTOMY 1 LEVEL (Left) - Left Lumbar Four-Five Microdiskectomy with microdissection  SURGEON:  Surgeon(s) and Role:    * Maeola Harman, MD - Primary  PHYSICIAN ASSISTANT:   ASSISTANTS: none   ANESTHESIA:   general  EBL:  Total I/O In: 1200 [I.V.:1200] Out: 100 [Blood:100]  BLOOD ADMINISTERED:none  DRAINS: none   LOCAL MEDICATIONS USED:  LIDOCAINE   SPECIMEN:  No Specimen  DISPOSITION OF SPECIMEN:  N/A  COUNTS:  YES  TOURNIQUET:  * No tourniquets in log *  DICTATION: DICTATION: Patient has an L4-5 disc rupture on the left with significant left leg weakness and pain. It was elected to take him to surgery for left L4-5 microdiscectomy.  Procedure: Patient was brought to the operating room and following the smooth and uncomplicated induction of general endotracheal anesthesia he was placed in a prone position on the Wilson frame. Low back was prepped and draped in the usual sterile fashion with DuraPrep. Area of planned incision was infiltrated with local lidocaine. Incision was made in the midline and carried to the lumbodorsal fascia which was incised on the left side of midline. Subperiosteal dissection was performed exposing what was felt to be L45 level. Intraoperative x-ray demonstrated marker probes at L3-4 and L4-5.  A hemi-semi-laminectomy of L4 was performed a high-speed drill and completed with Kerrison rongeurs and a generous foraminotomy was performed overlying the superior aspect of the L5 lamina. Ligamentum flavum was detached and  removed in a piecemeal fashion and the L5 nerve root was decompressed laterally with removal of the superior aspect of the facet and ligamentum causing nerve root compression. The microscope was brought into the field and the L5 nerve root was mobilized medially. This exposed a large amount of soft disc material and a free fragment of herniated disc material. Multiple fragments were removed and these extended to the interspace which did not appear to be soft, so I elected not to further decompress the interspace.  There was evidence on the MRI of cephalad fragments and these were carefully removed and the L4 nerve root was also decompressed. At this point it was felt that all neural elements were well decompressed and there was no evidence of residual loose disc material. The interspace was then irrigated with bacitracin saline and no additional disc material was mobilized. Hemostasis was assured with bipolar electrocautery and the laminectomy defect was irrigated with Depo-Medrol and fentanyl. The lumbodorsal fascia was closed with 0 Vicryl sutures the subcutaneous tissues reapproximated 2-0 Vicryl inverted sutures and the skin edges were reapproximated with 3-0 Vicryl subcuticular stitch. The wound is dressed with Dermabond. Patient was extubated in the operating room and taken to recovery in stable and satisfactory condition having tolerated his operation well counts were correct at the end of the case.  PLAN OF CARE: Admit for overnight observation  PATIENT DISPOSITION:  PACU - hemodynamically stable.   Delay start of Pharmacological VTE agent (>24hrs) due to surgical blood loss or risk of bleeding: yes

## 2012-06-07 NOTE — Preoperative (Signed)
Beta Blockers   Reason not to administer Beta Blockers:Not Applicable, took carvedilol this am.

## 2012-06-07 NOTE — Anesthesia Preprocedure Evaluation (Addendum)
Anesthesia Evaluation  Patient identified by MRN, date of birth, ID band Patient awake    Reviewed: Allergy & Precautions, H&P , NPO status , Patient's Chart, lab work & pertinent test results  Airway Mallampati: II      Dental  (+) Teeth Intact and Dental Advisory Given   Pulmonary  breath sounds clear to auscultation        Cardiovascular Rhythm:Regular Rate:Normal     Neuro/Psych    GI/Hepatic   Endo/Other    Renal/GU      Musculoskeletal   Abdominal (+) + obese,   Peds  Hematology   Anesthesia Other Findings   Reproductive/Obstetrics                           Anesthesia Physical Anesthesia Plan  ASA: III  Anesthesia Plan: General   Post-op Pain Management:    Induction: Intravenous  Airway Management Planned: Oral ETT  Additional Equipment:   Intra-op Plan:   Post-operative Plan:   Informed Consent: I have reviewed the patients History and Physical, chart, labs and discussed the procedure including the risks, benefits and alternatives for the proposed anesthesia with the patient or authorized representative who has indicated his/her understanding and acceptance.   Dental advisory given  Plan Discussed with: CRNA and Surgeon  Anesthesia Plan Comments: (Htn- took carvedilol today Type 2 Dm glucose 180 Obesity H/O Post-op N/V  Plan GA)       Anesthesia Quick Evaluation

## 2012-06-07 NOTE — Interval H&P Note (Signed)
History and Physical Interval Note:  06/07/2012 9:34 AM  Ponce A Kia  has presented today for surgery, with the diagnosis of Lumbar hnp without myelopathy, Lumbar spondylosis, Lumbar degenerative disc disease, Lumbar radiculopathy  The various methods of treatment have been discussed with the patient and family. After consideration of risks, benefits and other options for treatment, the patient has consented to  Procedure(s) (LRB) with comments: LUMBAR LAMINECTOMY/DECOMPRESSION MICRODISCECTOMY 1 LEVEL (Left) - Left L4-5 Microdiskectomy as a surgical intervention .  The patient's history has been reviewed, patient examined, no change in status, stable for surgery.  I have reviewed the patient's chart and labs.  Questions were answered to the patient's satisfaction.     Mikelle Myrick D  Date of Initial H&P: 06/06/12  History reviewed, patient examined, no change in status, stable for surgery.

## 2012-06-07 NOTE — Op Note (Signed)
06/07/2012  12:12 PM  PATIENT:  Cory Kidd  51 y.o. male  PRE-OPERATIVE DIAGNOSIS:  Lumbar hnp without myelopathy, Lumbar spondylosis, Lumbar degenerative disc disease, Lumbar radiculopathy L4/5 left  POST-OPERATIVE DIAGNOSIS:  Lumbar hnp without myelopathy, Lumbar spondylosis, Lumbar degenerative disc disease, Lumbar radiculopathy L4/5 left  PROCEDURE:  Procedure(s) (LRB) with comments: LUMBAR LAMINECTOMY/DECOMPRESSION MICRODISCECTOMY 1 LEVEL (Left) - Left Lumbar Four-Five Microdiskectomy with microdissection  SURGEON:  Surgeon(s) and Role:    * Nadea Kirkland, MD - Primary  PHYSICIAN ASSISTANT:   ASSISTANTS: none   ANESTHESIA:   general  EBL:  Total I/O In: 1200 [I.V.:1200] Out: 100 [Blood:100]  BLOOD ADMINISTERED:none  DRAINS: none   LOCAL MEDICATIONS USED:  LIDOCAINE   SPECIMEN:  No Specimen  DISPOSITION OF SPECIMEN:  N/A  COUNTS:  YES  TOURNIQUET:  * No tourniquets in log *  DICTATION: DICTATION: Patient has an L4-5 disc rupture on the left with significant left leg weakness and pain. It was elected to take him to surgery for left L4-5 microdiscectomy.  Procedure: Patient was brought to the operating room and following the smooth and uncomplicated induction of general endotracheal anesthesia he was placed in a prone position on the Wilson frame. Low back was prepped and draped in the usual sterile fashion with DuraPrep. Area of planned incision was infiltrated with local lidocaine. Incision was made in the midline and carried to the lumbodorsal fascia which was incised on the left side of midline. Subperiosteal dissection was performed exposing what was felt to be L45 level. Intraoperative x-ray demonstrated marker probes at L3-4 and L4-5.  A hemi-semi-laminectomy of L4 was performed a high-speed drill and completed with Kerrison rongeurs and a generous foraminotomy was performed overlying the superior aspect of the L5 lamina. Ligamentum flavum was detached and  removed in a piecemeal fashion and the L5 nerve root was decompressed laterally with removal of the superior aspect of the facet and ligamentum causing nerve root compression. The microscope was brought into the field and the L5 nerve root was mobilized medially. This exposed a large amount of soft disc material and a free fragment of herniated disc material. Multiple fragments were removed and these extended to the interspace which did not appear to be soft, so I elected not to further decompress the interspace.  There was evidence on the MRI of cephalad fragments and these were carefully removed and the L4 nerve root was also decompressed. At this point it was felt that all neural elements were well decompressed and there was no evidence of residual loose disc material. The interspace was then irrigated with bacitracin saline and no additional disc material was mobilized. Hemostasis was assured with bipolar electrocautery and the laminectomy defect was irrigated with Depo-Medrol and fentanyl. The lumbodorsal fascia was closed with 0 Vicryl sutures the subcutaneous tissues reapproximated 2-0 Vicryl inverted sutures and the skin edges were reapproximated with 3-0 Vicryl subcuticular stitch. The wound is dressed with Dermabond. Patient was extubated in the operating room and taken to recovery in stable and satisfactory condition having tolerated his operation well counts were correct at the end of the case.  PLAN OF CARE: Admit for overnight observation  PATIENT DISPOSITION:  PACU - hemodynamically stable.   Delay start of Pharmacological VTE agent (>24hrs) due to surgical blood loss or risk of bleeding: yes  

## 2012-06-07 NOTE — Anesthesia Procedure Notes (Signed)
Procedure Name: Intubation Date/Time: 06/07/2012 9:45 AM Performed by: Angelica Pou Pre-anesthesia Checklist: Patient identified, Timeout performed, Emergency Drugs available, Suction available and Patient being monitored Patient Re-evaluated:Patient Re-evaluated prior to inductionOxygen Delivery Method: Circle system utilized Preoxygenation: Pre-oxygenation with 100% oxygen Intubation Type: IV induction Ventilation: Two handed mask ventilation required and Oral airway inserted - appropriate to patient size Laryngoscope Size: Mac and 3 Grade View: Grade IV Tube type: Oral Tube size: 7.5 mm Number of attempts: 1 Airway Equipment and Method: Bougie stylet and Oral airway Placement Confirmation: breath sounds checked- equal and bilateral and positive ETCO2 Secured at: 22 cm Tube secured with: Tape Dental Injury: Teeth and Oropharynx as per pre-operative assessment  Comments: DL x1. Poor view with Mac 3, bougie easily passed, tracheal rings appreciated. ETT easily passed over bougie, cuff inflated and bougie removed.  AOI.  BS=B, +etCO2.

## 2012-06-07 NOTE — Discharge Summary (Signed)
Physician Discharge Summary  Patient ID: Cory Kidd MRN: [id number] DOB/AGE: 10/04/1960 51 y.o.  Admit date: 06/07/2012 Discharge date: 06/07/2012  Admission Diagnoses:Herniated lumbar disc L4/5 Left  Discharge Diagnoses: Same Active Problems:  * No active hospital problems. *    Discharged Condition: good  Hospital Course: Uncomplicated microdiscectomy L4/5 Left.  Patient ambulating with improved strength and diminished pain.  Consults: None  Significant Diagnostic Studies: None  Treatments: surgery: microdiscectomy L4/5 Left  Discharge Exam: Blood pressure 122/78, pulse 64, temperature 97.6 F (36.4 C), temperature source Oral, resp. rate 18, SpO2 94.00%. Neurologic: Alert and oriented X 3, normal strength and tone. Normal symmetric reflexes. Normal coordination and gait Wound:CDI  Disposition: Home     Medication List     As of 06/07/2012  1:48 PM    ASK your doctor about these medications         ALPRAZolam 1 MG tablet   Commonly known as: XANAX   Take 1 mg by mouth 2 (two) times daily as needed. For anxiety      carvedilol 25 MG tablet   Commonly known as: COREG   Take 25 mg by mouth 2 (two) times daily with a meal.      gabapentin 300 MG capsule   Commonly known as: NEURONTIN   Take 300 mg by mouth 2 (two) times daily.      insulin glargine 100 UNIT/ML injection   Commonly known as: LANTUS   Inject 20 Units into the skin 2 (two) times daily.      insulin lispro 100 UNIT/ML injection   Commonly known as: HUMALOG   Inject 0-40 Units into the skin as needed. Home sliding scale is CBG >200      meloxicam 15 MG tablet   Commonly known as: MOBIC   Take 15 mg by mouth daily.      multivitamin with minerals Tabs   Take 1 tablet by mouth daily.      neomycin-bacitracin-polymyxin ointment   Commonly known as: NEOSPORIN   Apply 1 application topically as needed. Apply to foot      oxyCODONE-acetaminophen 10-325 MG per tablet   Commonly known as:  PERCOCET   Take 1 tablet by mouth every 6 (six) hours as needed. For pain      sertraline 100 MG tablet   Commonly known as: ZOLOFT   Take 100 mg by mouth daily.      sitaGLIPtin 100 MG tablet   Commonly known as: JANUVIA   Take 100 mg by mouth daily.         Signed: Dorian Heckle, MD 06/07/2012, 1:48 PM

## 2012-06-07 NOTE — Anesthesia Postprocedure Evaluation (Signed)
  Anesthesia Post-op Note  Patient: Cory Kidd  Procedure(s) Performed: Procedure(s) (LRB) with comments: LUMBAR LAMINECTOMY/DECOMPRESSION MICRODISCECTOMY 1 LEVEL (Left) - Left Lumbar Four-Five Microdiskectomy  Patient Location: PACU  Anesthesia Type:General  Level of Consciousness: awake, alert  and oriented  Airway and Oxygen Therapy: Patient Spontanous Breathing and Patient connected to nasal cannula oxygen  Post-op Pain: mild  Post-op Assessment: Post-op Vital signs reviewed and Patient's Cardiovascular Status Stable  Post-op Vital Signs: stable  Complications: No apparent anesthesia complications

## 2012-06-07 NOTE — Progress Notes (Signed)
Awake, alert, conversant.  Full strength left lower extremity with improved pain and numbness. Doing well.

## 2012-06-07 NOTE — Transfer of Care (Signed)
Immediate Anesthesia Transfer of Care Note  Patient: Cory Kidd  Procedure(s) Performed: Procedure(s) (LRB) with comments: LUMBAR LAMINECTOMY/DECOMPRESSION MICRODISCECTOMY 1 LEVEL (Left) - Left Lumbar Four-Five Microdiskectomy  Patient Location: PACU  Anesthesia Type:General  Level of Consciousness: awake, alert , oriented and patient cooperative  Airway & Oxygen Therapy: Patient Spontanous Breathing and Patient connected to nasal cannula oxygen  Post-op Assessment: Report given to PACU RN, Post -op Vital signs reviewed and stable and Patient moving all extremities  Post vital signs: Reviewed and stable  Complications: No apparent anesthesia complications

## 2012-06-07 NOTE — Progress Notes (Signed)
Pt doing well. Pt is OOB ambulating without difficulty. Pt has voided and his pain is well controlled. Pt D/C'd home via wheelchair @ 1448 per MD order. Rema Fendt, RN

## 2012-06-10 ENCOUNTER — Encounter (HOSPITAL_COMMUNITY): Payer: Self-pay | Admitting: Neurosurgery

## 2012-07-09 ENCOUNTER — Other Ambulatory Visit: Payer: Self-pay | Admitting: Internal Medicine

## 2012-07-09 ENCOUNTER — Other Ambulatory Visit: Payer: Self-pay | Admitting: Neurosurgery

## 2012-07-09 DIAGNOSIS — M5126 Other intervertebral disc displacement, lumbar region: Secondary | ICD-10-CM

## 2012-07-15 ENCOUNTER — Ambulatory Visit
Admission: RE | Admit: 2012-07-15 | Discharge: 2012-07-15 | Disposition: A | Discharge status: Home / Self Care | Payer: BC Managed Care – PPO | Source: Ambulatory Visit | Attending: Neurosurgery | Admitting: Neurosurgery

## 2012-07-15 DIAGNOSIS — M5126 Other intervertebral disc displacement, lumbar region: Secondary | ICD-10-CM

## 2012-07-15 MED ORDER — GADOBENATE DIMEGLUMINE 529 MG/ML IV SOLN
20.0000 mL | Freq: Once | INTRAVENOUS | Status: AC | PRN
  Administered 2012-07-15: 20 mL via INTRAVENOUS

## 2015-08-10 DIAGNOSIS — E119 Type 2 diabetes mellitus without complications: Secondary | ICD-10-CM | POA: Insufficient documentation

## 2015-08-10 DIAGNOSIS — F418 Other specified anxiety disorders: Secondary | ICD-10-CM | POA: Insufficient documentation

## 2015-08-10 DIAGNOSIS — E1165 Type 2 diabetes mellitus with hyperglycemia: Secondary | ICD-10-CM | POA: Insufficient documentation

## 2015-08-10 DIAGNOSIS — I10 Essential (primary) hypertension: Secondary | ICD-10-CM | POA: Insufficient documentation

## 2015-12-03 DIAGNOSIS — E782 Mixed hyperlipidemia: Secondary | ICD-10-CM | POA: Insufficient documentation

## 2021-03-08 ENCOUNTER — Ambulatory Visit (INDEPENDENT_AMBULATORY_CARE_PROVIDER_SITE_OTHER): Payer: 59

## 2021-03-08 ENCOUNTER — Ambulatory Visit: Payer: 59 | Admitting: Podiatry

## 2021-03-08 ENCOUNTER — Other Ambulatory Visit: Payer: Self-pay

## 2021-03-08 DIAGNOSIS — M66871 Spontaneous rupture of other tendons, right ankle and foot: Secondary | ICD-10-CM

## 2021-03-08 DIAGNOSIS — M79671 Pain in right foot: Secondary | ICD-10-CM

## 2021-03-08 DIAGNOSIS — F410 Panic disorder [episodic paroxysmal anxiety] without agoraphobia: Secondary | ICD-10-CM | POA: Insufficient documentation

## 2021-03-08 NOTE — Progress Notes (Signed)
   HPI: 60 y.o. male presenting today as a new patient for evaluation of right foot and ankle pain with swelling.  Patient states that about 2 weeks ago he heard a large pop in his foot when he was getting out of bed.  He states that his foot was cramping and when he applied pressure he heard a large pop.  Ever since the injury his foot has been "slapping the ground" during gait.  Patient states that he has had pain with tenderness and increased swelling ever since the injury.  Currently he has not done anything for treatment.  He presents for further treatment and evaluation  Past Medical History:  Diagnosis Date   Anxiety    Arthritis    knee   Bronchitis    hx of   Complication of anesthesia    Depression    Diabetes mellitus without complication (HCC)    Headache(784.0)    hx of   Hypertension    Dr. Sela Hua, [phone]   PONV (postoperative nausea and vomiting)      Physical Exam: General: The patient is alert and oriented x3 in no acute distress.  Dermatology: Skin is warm, dry and supple bilateral lower extremities. Negative for open lesions or macerations.  Vascular: Palpable pedal pulses bilaterally.  No erythema.  Moderate edema noted throughout the soft tissue area of the ankle.  Capillary refill within normal limits.  Neurological: Epicritic and protective threshold grossly intact bilaterally.   Musculoskeletal Exam: Range of motion within normal limits to all pedal and ankle joints bilateral. Muscle strength 5/5 in all groups bilateral.  There is a large palpable lump noted to the anterior aspect of the ankle possibly an area of the anterior tibial tendon which is concerning for possible rupture  Radiographic Exam:  Normal osseous mineralization. Joint spaces preserved. No fracture/dislocation/boney destruction.    Assessment: 1.  Possible anterior tibial tendon rupture, nontraumatic, right   Plan of Care:  1. Patient evaluated. X-Rays reviewed.  2.   Patient placed in the cam boot today.  Cam boot dispensed.  Weightbearing as tolerated. 3.  Today we are going to order MRI right ankle 4.  Return to clinic after MRI to review results and discuss further treatment options both conservative and surgical.  Currently the patient does not want to pursue any surgical option.  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 Felecia Shelling, DPM    [address] [place], [place] [address]                Office [phone]  Fax [phone]

## 2021-03-22 ENCOUNTER — Ambulatory Visit
Admission: RE | Admit: 2021-03-22 | Discharge: 2021-03-22 | Disposition: A | Discharge status: Home / Self Care | Payer: Self-pay | Source: Ambulatory Visit | Attending: Podiatry | Admitting: Podiatry

## 2021-03-22 DIAGNOSIS — M66871 Spontaneous rupture of other tendons, right ankle and foot: Secondary | ICD-10-CM

## 2021-04-01 ENCOUNTER — Encounter: Payer: Self-pay | Admitting: Podiatry

## 2021-04-01 ENCOUNTER — Other Ambulatory Visit: Payer: Self-pay

## 2021-04-01 ENCOUNTER — Ambulatory Visit: Payer: 59 | Admitting: Podiatry

## 2021-04-01 DIAGNOSIS — M66871 Spontaneous rupture of other tendons, right ankle and foot: Secondary | ICD-10-CM | POA: Diagnosis not present

## 2021-04-17 NOTE — Progress Notes (Signed)
   HPI: 60 y.o. male presenting today for follow-up evaluation of right foot and ankle pain with swelling.  Patient states that he is actually doing very well.  He is able to function and get around just fine.  He had the MRI completed that was ordered last visit and he presents for further evaluation  Patient states that about 2 weeks prior to initial presentation he heard a large pop in his foot when he was getting out of bed.  He states that his foot was cramping and when he applied pressure he heard a large pop.  Ever since the injury his foot has been "slapping the ground" during gait.  Patient states that he has had pain with tenderness and increased swelling ever since the injury.    Past Medical History:  Diagnosis Date   Anxiety    Arthritis    knee   Bronchitis    hx of   Complication of anesthesia    Depression    Diabetes mellitus without complication (HCC)    Headache(784.0)    hx of   Hypertension    Dr. Sela Hua, [phone]   PONV (postoperative nausea and vomiting)      Physical Exam: General: The patient is alert and oriented x3 in no acute distress.  Dermatology: Skin is warm, dry and supple bilateral lower extremities. Negative for open lesions or macerations.  Vascular: Palpable pedal pulses bilaterally.  No erythema.  Moderate edema noted throughout the soft tissue area of the ankle.  Capillary refill within normal limits.  Neurological: Epicritic and protective threshold grossly intact bilaterally.   Musculoskeletal Exam: Range of motion within normal limits to all pedal and ankle joints bilateral. Muscle strength 5/5 in all groups bilateral.  There is a large palpable lump noted to the anterior aspect of the ankle possibly an area of the anterior tibial tendon which is concerning for possible rupture  MR ANKLE RT WO CONTRAST 03/22/2021: IMPRESSION: 1. 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 2. 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  Assessment: 1.  anterior tibial tendon rupture, nontraumatic, right   Plan of Care:  1. Patient evaluated.  MRI reviewed today 2.  Today we discussed the possibility of surgery to repair the torn tendon in the foot and ankle.  The patient states that he has minimal pain and he is able to walk and ambulate just fine without any foot drop or concerns.  He would rather not pursue any surgery that would put him out of work for prolonged period of time.  He would like to pursue and continue conservative treatment 3.  Continue wearing good supportive shoes 4.  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 Felecia Shelling, DPM    [address] [place], [place] [address]                Office [phone]  Fax [phone]

## 2022-05-06 IMAGING — MR MR ANKLE*R* W/O CM
5 series · 40 of 40 positions shown · non-contrast
Comparison: Right foot x-rays dated March 08, 2021.

CLINICAL DATA: Lateral ankle pain and foot drop for the past month.
No injury.

EXAM:
MRI OF THE RIGHT ANKLE WITHOUT CONTRAST
TECHNIQUE: Multiplanar, multisequence MR imaging of the ankle was performed. No
intravenous contrast was administered.

[Series 4: T2 fat-sat · axial · 3.0mm · 0.50mm/px · z∈[-96,+37]mm · 10 of 35 slices shown (1 of 2)]
[im 1/35]
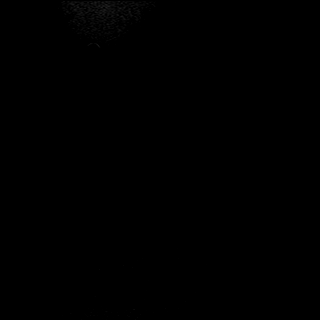
[im 4/35]
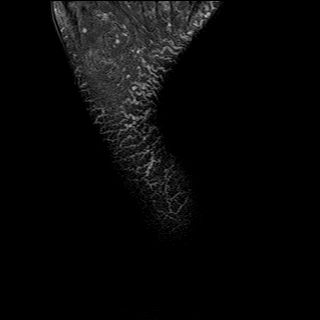
[im 8/35]
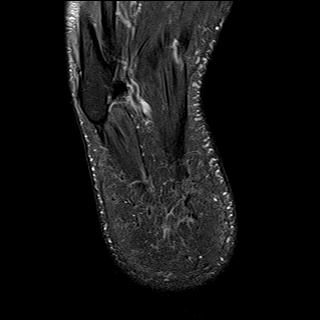
[im 12/35]
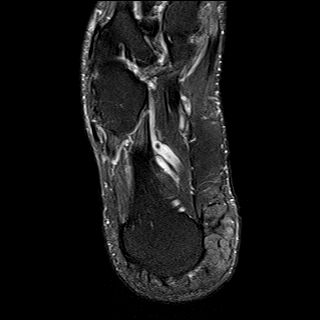
[im 16/35]
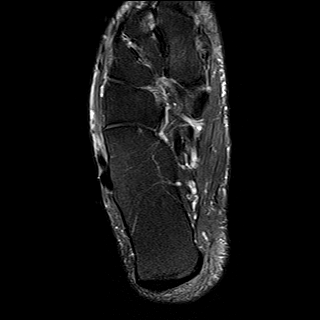
[im 19/35]
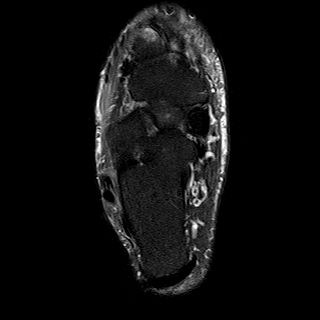
[im 23/35]
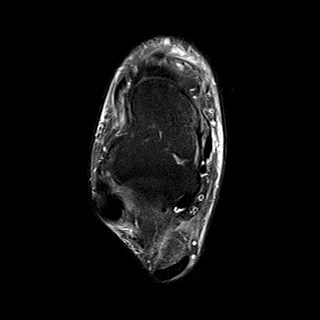
[im 27/35]
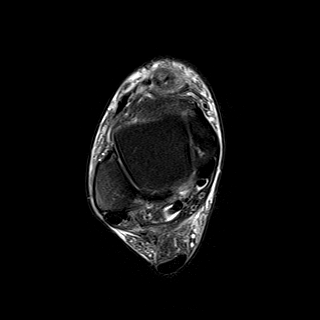
[im 31/35]
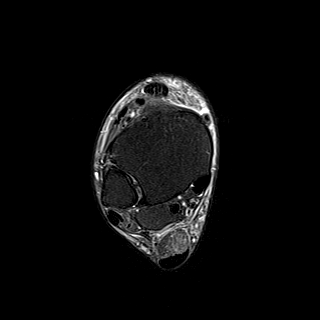
[im 35/35]
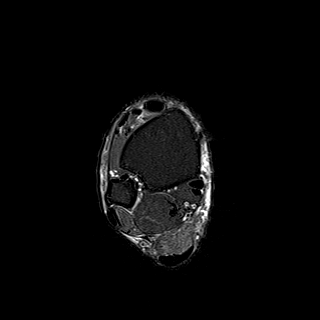

[Series 5: PD fat-sat · axial · 3.0mm · 0.50mm/px · z∈[-96,+37]mm · 10 of 35 slices shown]
[im 1/35]
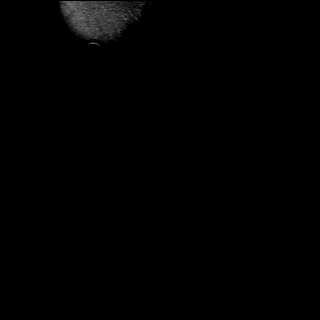
[im 4/35]
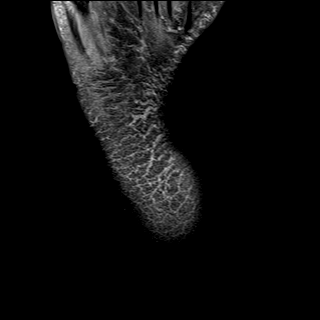
[im 8/35]
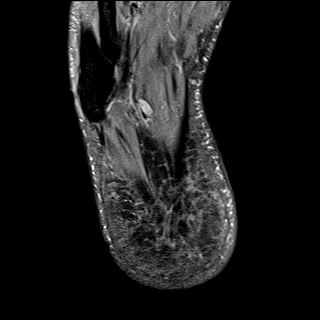
[im 12/35]
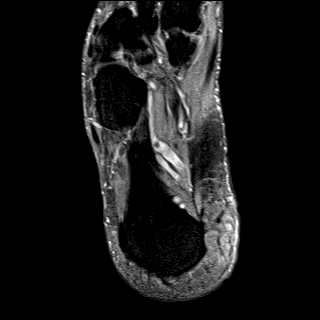
[im 16/35]
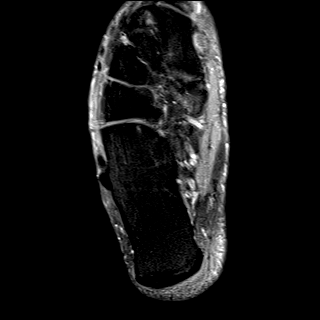
[im 19/35]
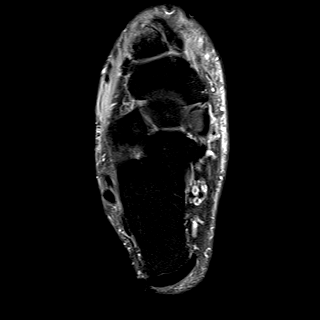
[im 23/35]
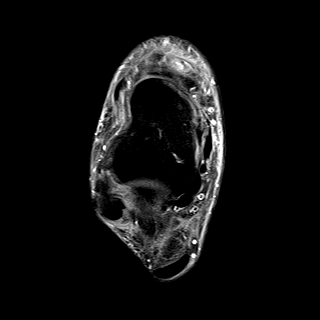
[im 27/35]
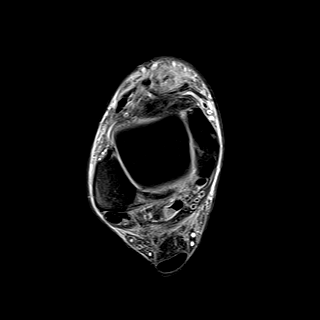
[im 31/35]
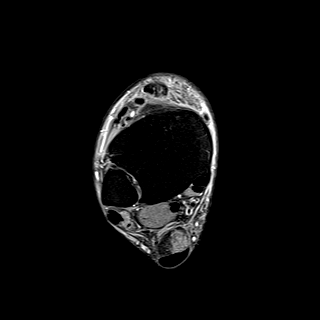
[im 35/35]
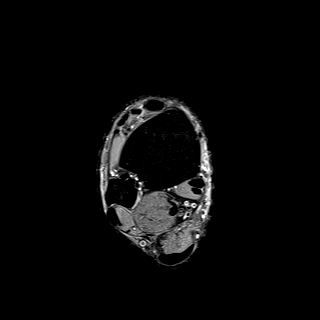

[Series 7: STIR · sagittal · 4.0mm · 0.35mm/px · 5 of 20 slices shown]
[im 1/20]
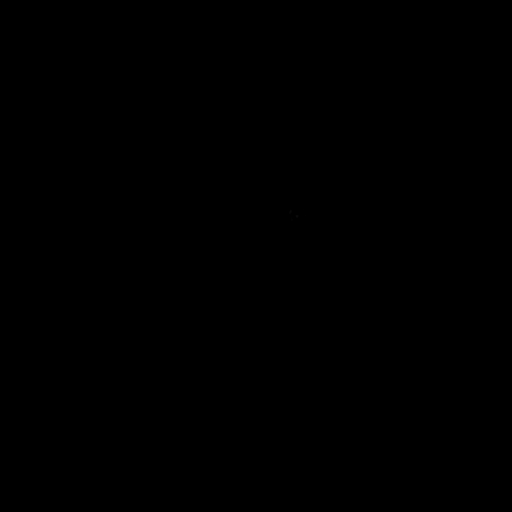
[im 5/20]
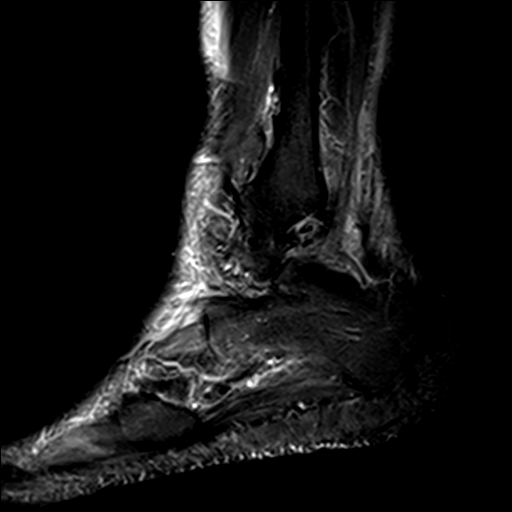
[im 10/20]
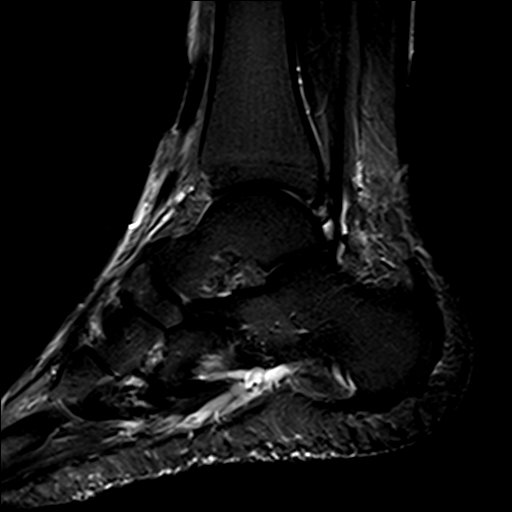
[im 15/20]
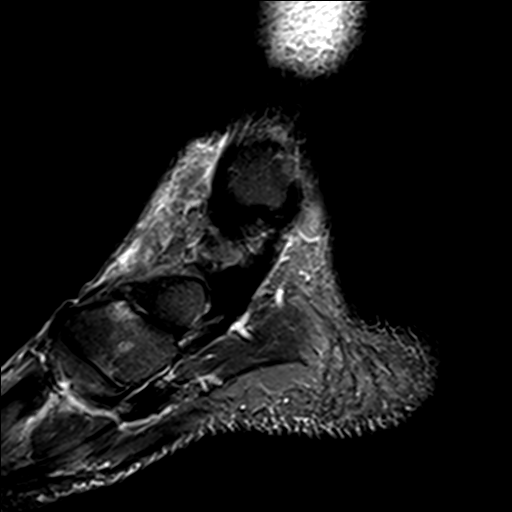
[im 20/20]
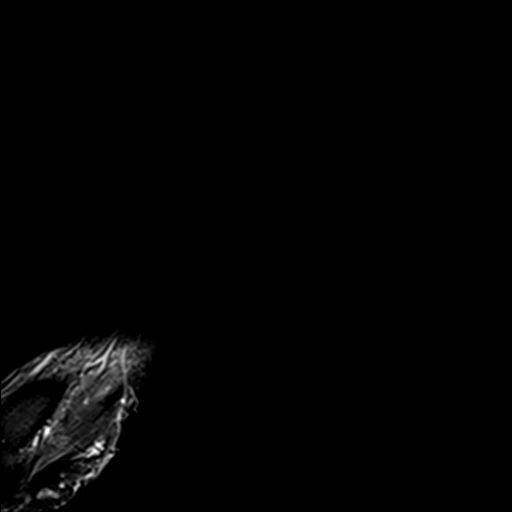

[Series 8: T2 fat-sat · coronal · 3.0mm · 0.50mm/px · 10 of 37 slices shown (2 of 2)]
[im 1/37]
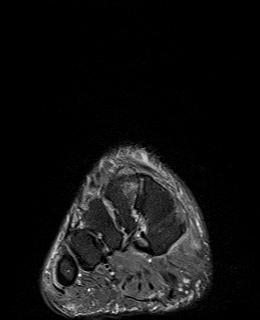
[im 5/37]
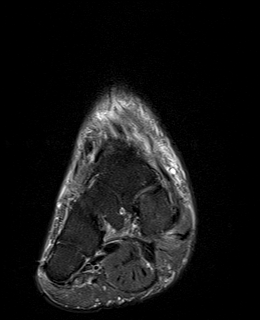
[im 9/37]
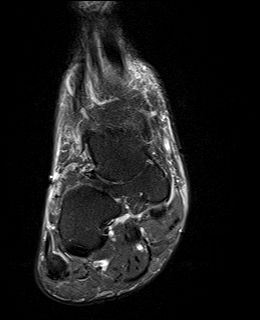
[im 13/37]
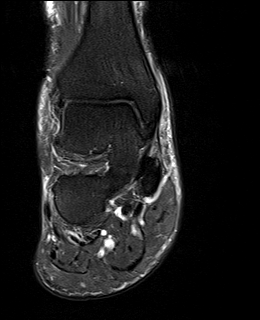
[im 17/37]
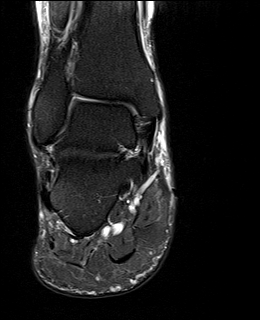
[im 21/37]
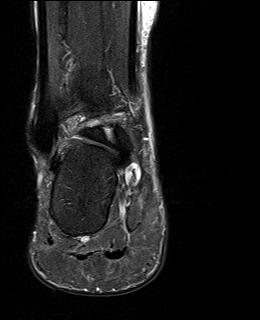
[im 25/37]
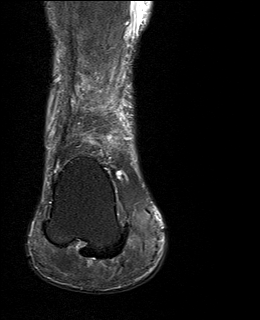
[im 29/37]
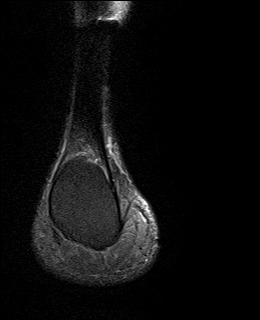
[im 33/37]
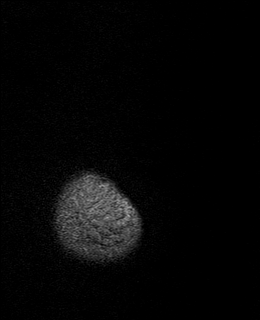
[im 37/37]
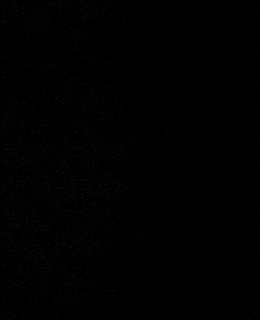

[Series 10: T1 · sagittal · 4.0mm · 0.70mm/px · 5 of 20 slices shown]
[im 1/20]
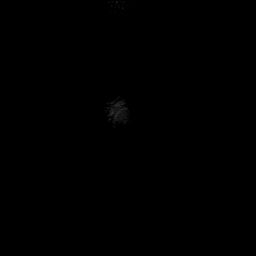
[im 5/20]
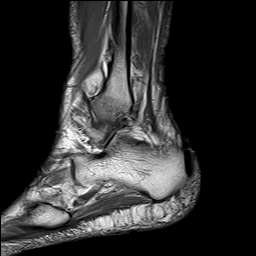
[im 10/20]
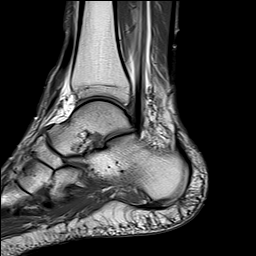
[im 15/20]
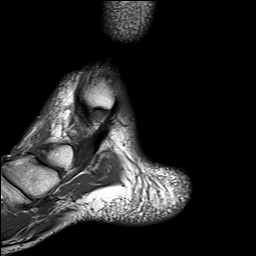
[im 20/20]
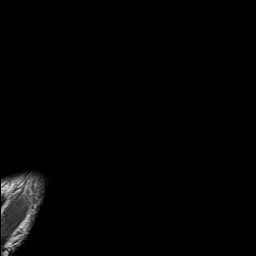

[40 of 40 positions shown; findings below may reference images not displayed]

FINDINGS: TENDONS

Peroneal: Peroneal longus tendon intact. Peroneal brevis intact.

Posteromedial: Posterior tibial tendon intact. Flexor digitorum
longus tendon intact. Flexor hallucis longus tendon intact.

Anterior: Severe anterior tibialis tendinosis with high-grade near
complete tear (series 4, image 10; series 7, image 13). Extensor
hallucis longus tendon intact Extensor digitorum longus tendon
intact.

Achilles:  Intact.

Plantar Fascia: Intact.

LIGAMENTS

Lateral: Anterior talofibular ligament intact. Calcaneofibular
ligament intact. Posterior talofibular ligament intact. Anterior and
posterior tibiofibular ligaments intact.

Medial: Deltoid ligament intact. Spring ligament intact.

CARTILAGE

Ankle Joint: No joint effusion. Normal ankle mortise. No chondral
defect.

Subtalar Joints/Sinus Tarsi: Normal subtalar joints. No subtalar
joint effusion. Normal sinus tarsi.

Bones: No marrow signal abnormality. No fracture or dislocation.
Mild naviculocuneiform and second TMT joint osteoarthritis.

Soft Tissue: Mild diffuse soft tissue swelling. No soft tissue mass
or fluid collection.
IMPRESSION: 1. Severe anterior tibialis tendinosis with high-grade near complete
tear.
2. Mild naviculocuneiform and second TMT joint osteoarthritis.
